# Patient Record
Sex: Female | Born: 1985 | Hispanic: Yes | Marital: Single | Smoking: Never smoker
Health system: Southern US, Community
[De-identification: ages and names within clinical notes are randomized; demographics above are authoritative.]

## PROBLEM LIST (undated history)

## (undated) DIAGNOSIS — K8020 Calculus of gallbladder without cholecystitis without obstruction: Secondary | ICD-10-CM

## (undated) HISTORY — PX: WISDOM TOOTH EXTRACTION: SHX21

---
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
## 2005-05-24 ENCOUNTER — Inpatient Hospital Stay (HOSPITAL_COMMUNITY): Admission: AD | Admit: 2005-05-24 | Discharge: 2005-05-24 | Payer: Self-pay | Admitting: Gynecology

## 2010-06-13 ENCOUNTER — Other Ambulatory Visit (HOSPITAL_COMMUNITY): Payer: Self-pay | Admitting: Obstetrics and Gynecology

## 2010-06-13 DIAGNOSIS — O2690 Pregnancy related conditions, unspecified, unspecified trimester: Secondary | ICD-10-CM

## 2010-06-13 DIAGNOSIS — Z0489 Encounter for examination and observation for other specified reasons: Secondary | ICD-10-CM

## 2010-06-16 ENCOUNTER — Other Ambulatory Visit (HOSPITAL_COMMUNITY): Payer: Self-pay | Admitting: Obstetrics and Gynecology

## 2010-06-16 ENCOUNTER — Ambulatory Visit (HOSPITAL_COMMUNITY)
Admission: RE | Admit: 2010-06-16 | Discharge: 2010-06-16 | Disposition: A | Discharge status: Home / Self Care | Payer: Medicaid Other | Source: Ambulatory Visit | Attending: Obstetrics and Gynecology | Admitting: Obstetrics and Gynecology

## 2010-06-16 DIAGNOSIS — O337XX0 Maternal care for disproportion due to other fetal deformities, not applicable or unspecified: Secondary | ICD-10-CM | POA: Insufficient documentation

## 2010-06-16 DIAGNOSIS — Z0489 Encounter for examination and observation for other specified reasons: Secondary | ICD-10-CM

## 2010-06-16 DIAGNOSIS — O09299 Supervision of pregnancy with other poor reproductive or obstetric history, unspecified trimester: Secondary | ICD-10-CM | POA: Insufficient documentation

## 2010-06-16 DIAGNOSIS — IMO0002 Reserved for concepts with insufficient information to code with codable children: Secondary | ICD-10-CM

## 2010-06-16 DIAGNOSIS — O352XX0 Maternal care for (suspected) hereditary disease in fetus, not applicable or unspecified: Secondary | ICD-10-CM | POA: Insufficient documentation

## 2010-06-16 DIAGNOSIS — O2690 Pregnancy related conditions, unspecified, unspecified trimester: Secondary | ICD-10-CM

## 2010-06-16 DIAGNOSIS — O358XX0 Maternal care for other (suspected) fetal abnormality and damage, not applicable or unspecified: Secondary | ICD-10-CM | POA: Insufficient documentation

## 2010-06-16 IMAGING — US US OB DETAIL+14 WK
1 series · 14 of 28 positions shown · non-contrast
Comparison: none

[Series 1: us ob detail+14 wk · 0.15mm/px · 14 of 115 slices shown]
[im 5/115]
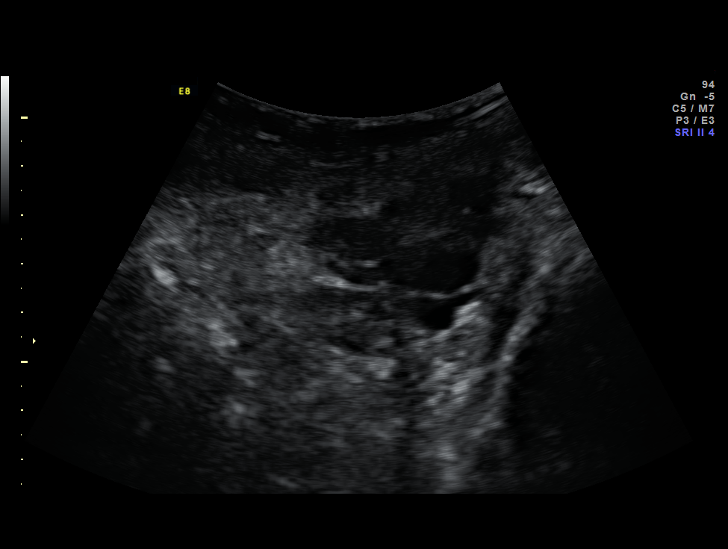
[im 13/115]
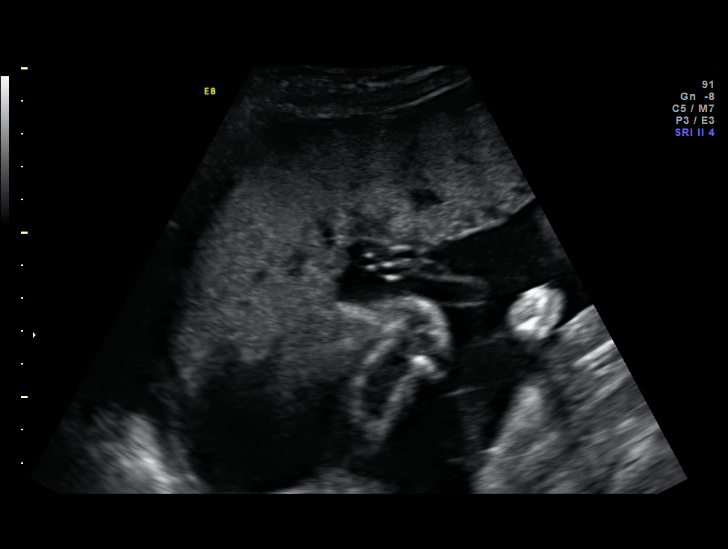
[im 22/115]
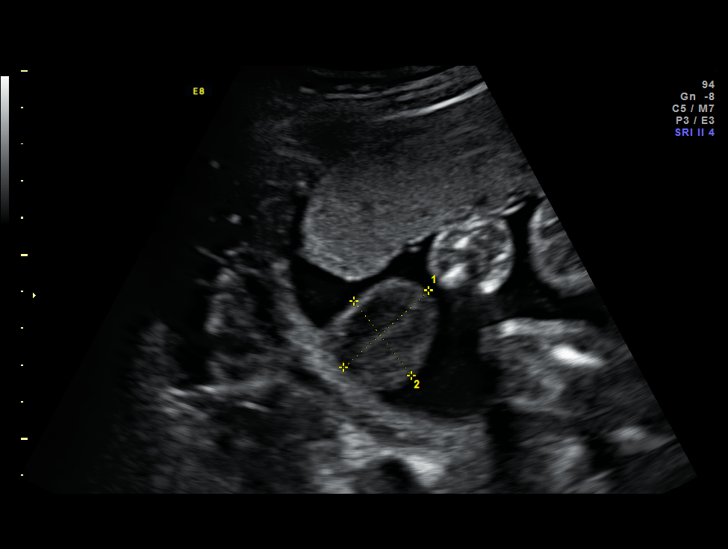
[im 30/115]
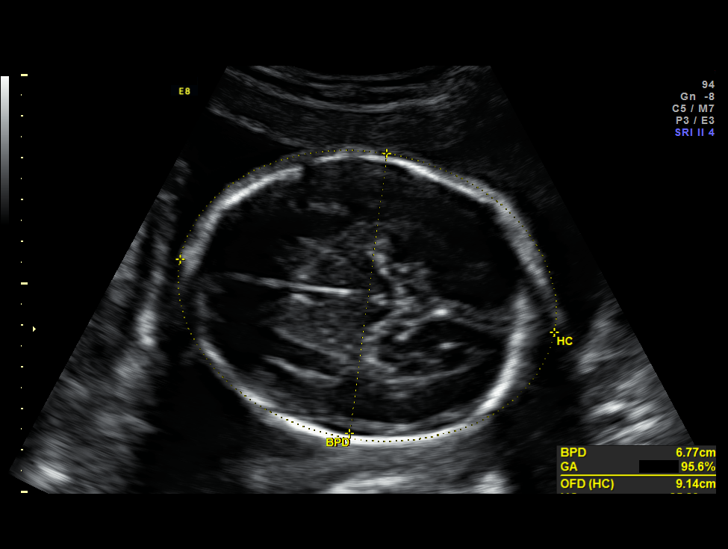
[im 39/115]
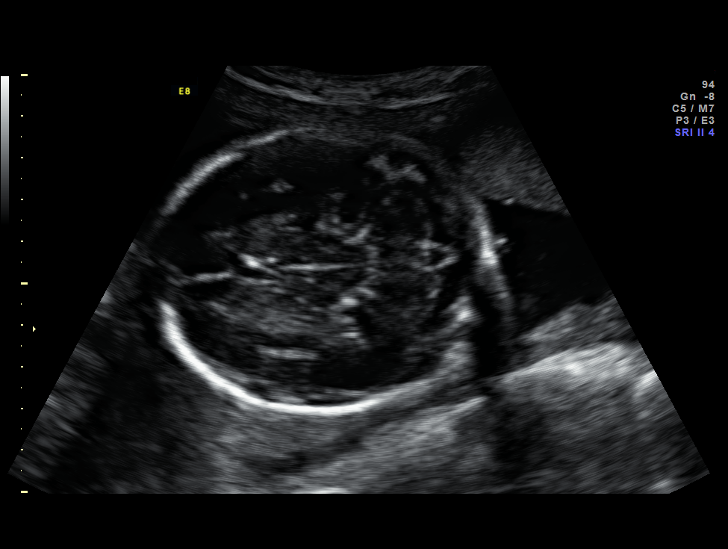
[im 47/115]
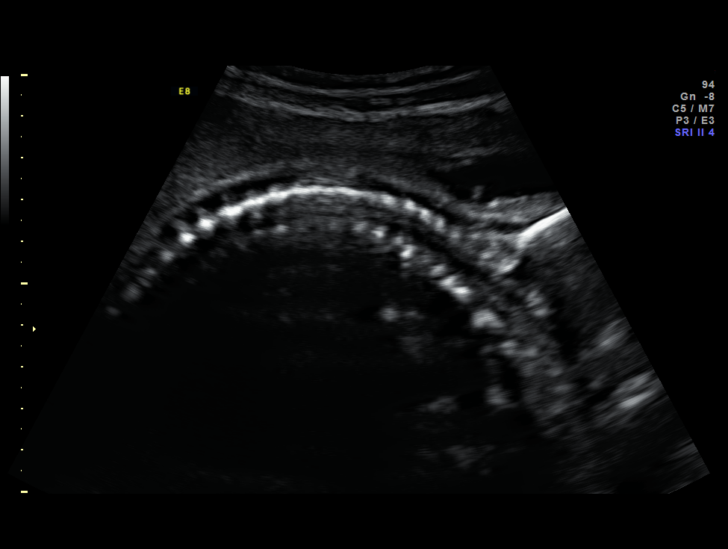
[im 55/115]
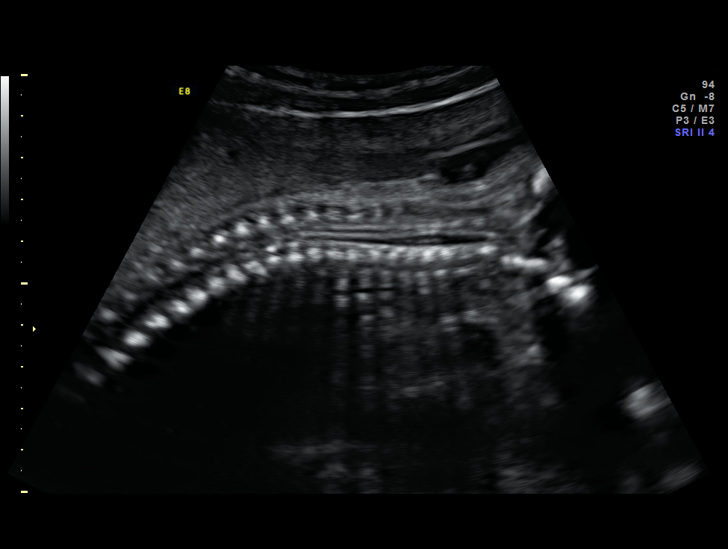
[im 64/115]
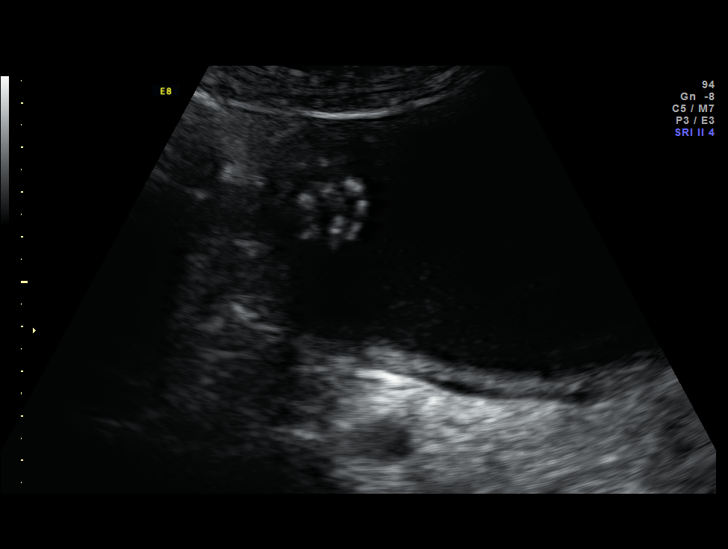
[im 72/115]
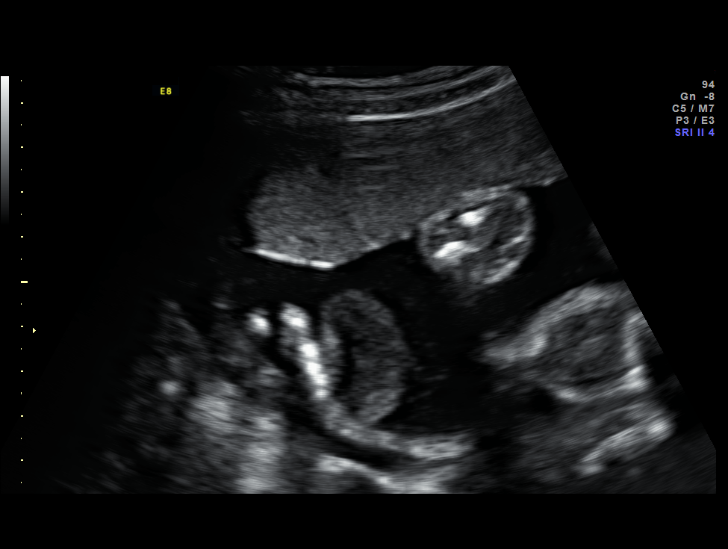
[im 81/115]
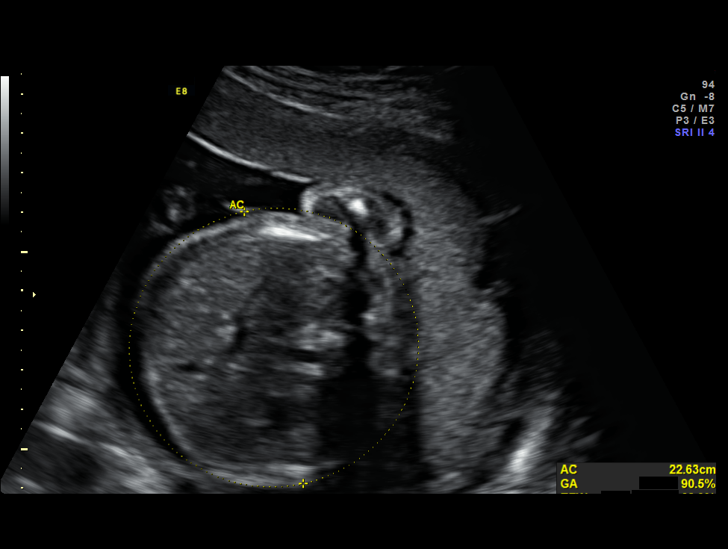
[im 89/115]
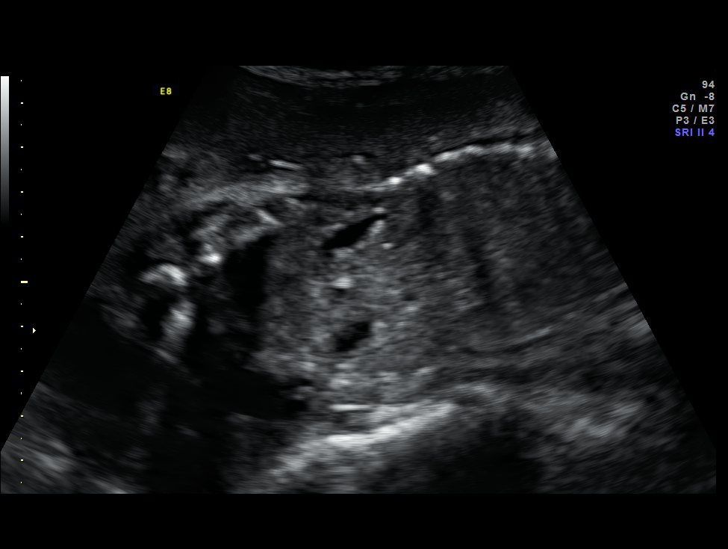
[im 98/115]
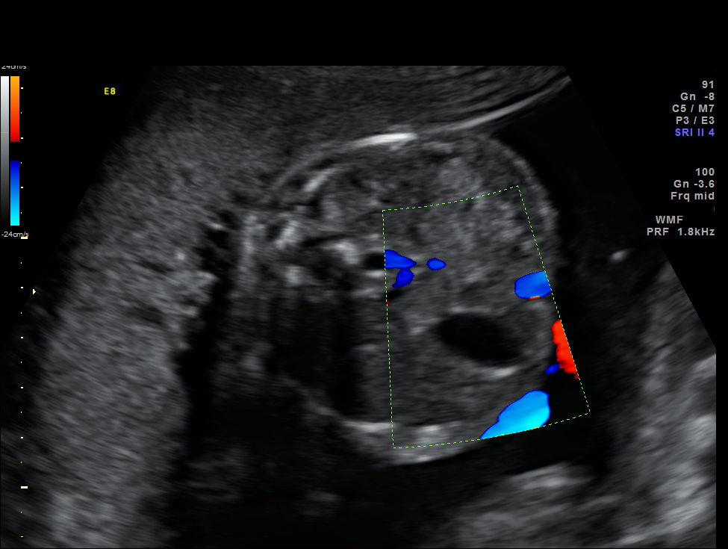
[im 106/115]
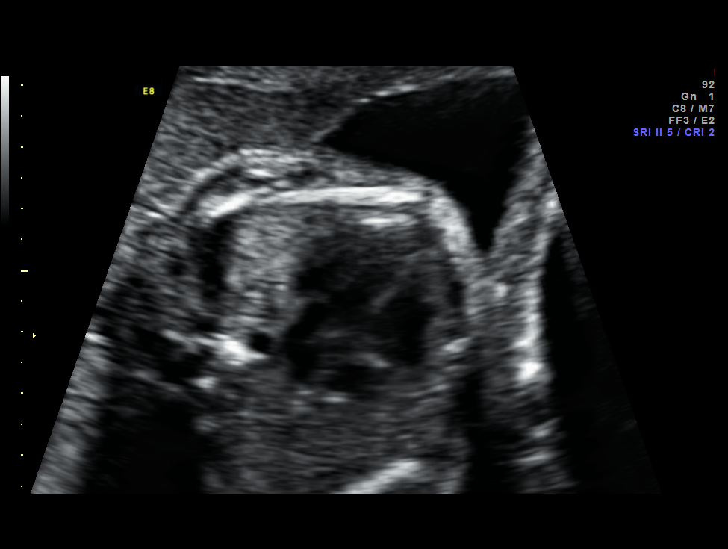
[im 115/115]
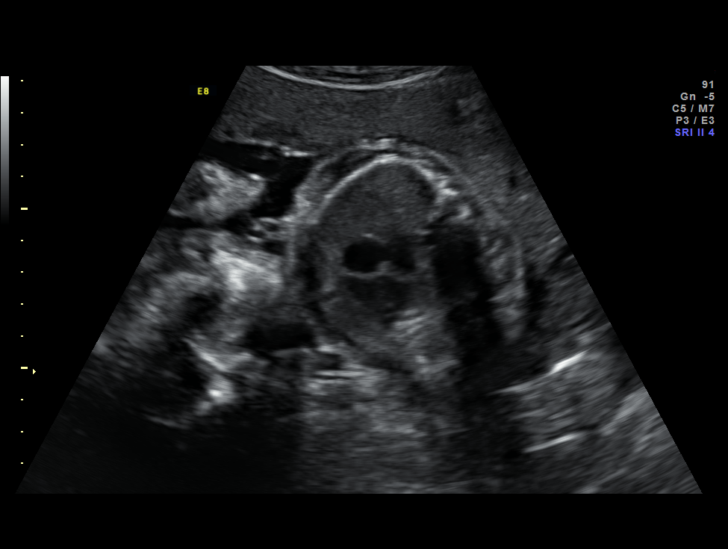

[14 of 28 positions shown; findings below may reference images not displayed]

Canned report from images found in remote index.

Refer to host system for actual result text.

## 2010-07-14 ENCOUNTER — Other Ambulatory Visit (HOSPITAL_COMMUNITY): Payer: Self-pay | Admitting: Obstetrics and Gynecology

## 2010-07-14 ENCOUNTER — Ambulatory Visit (HOSPITAL_COMMUNITY)
Admission: RE | Admit: 2010-07-14 | Discharge: 2010-07-14 | Disposition: A | Discharge status: Home / Self Care | Payer: Medicaid Other | Source: Ambulatory Visit | Attending: Obstetrics and Gynecology | Admitting: Obstetrics and Gynecology

## 2010-07-14 DIAGNOSIS — Z0489 Encounter for examination and observation for other specified reasons: Secondary | ICD-10-CM

## 2010-07-14 DIAGNOSIS — O2690 Pregnancy related conditions, unspecified, unspecified trimester: Secondary | ICD-10-CM

## 2010-07-14 DIAGNOSIS — O09299 Supervision of pregnancy with other poor reproductive or obstetric history, unspecified trimester: Secondary | ICD-10-CM | POA: Insufficient documentation

## 2010-07-14 DIAGNOSIS — O358XX0 Maternal care for other (suspected) fetal abnormality and damage, not applicable or unspecified: Secondary | ICD-10-CM | POA: Insufficient documentation

## 2010-07-14 DIAGNOSIS — O352XX0 Maternal care for (suspected) hereditary disease in fetus, not applicable or unspecified: Secondary | ICD-10-CM | POA: Insufficient documentation

## 2010-07-14 DIAGNOSIS — O337XX0 Maternal care for disproportion due to other fetal deformities, not applicable or unspecified: Secondary | ICD-10-CM | POA: Insufficient documentation

## 2010-08-23 ENCOUNTER — Ambulatory Visit (HOSPITAL_COMMUNITY)
Admission: RE | Admit: 2010-08-23 | Discharge: 2010-08-23 | Disposition: A | Discharge status: Home / Self Care | Payer: Medicaid Other | Source: Ambulatory Visit | Attending: Obstetrics and Gynecology | Admitting: Obstetrics and Gynecology

## 2010-08-23 ENCOUNTER — Encounter (HOSPITAL_COMMUNITY): Payer: Self-pay

## 2010-08-23 DIAGNOSIS — O352XX0 Maternal care for (suspected) hereditary disease in fetus, not applicable or unspecified: Secondary | ICD-10-CM | POA: Insufficient documentation

## 2010-08-23 DIAGNOSIS — Z0489 Encounter for examination and observation for other specified reasons: Secondary | ICD-10-CM

## 2010-08-23 DIAGNOSIS — O358XX0 Maternal care for other (suspected) fetal abnormality and damage, not applicable or unspecified: Secondary | ICD-10-CM | POA: Insufficient documentation

## 2010-08-23 DIAGNOSIS — O2690 Pregnancy related conditions, unspecified, unspecified trimester: Secondary | ICD-10-CM

## 2010-08-23 DIAGNOSIS — O09299 Supervision of pregnancy with other poor reproductive or obstetric history, unspecified trimester: Secondary | ICD-10-CM | POA: Insufficient documentation

## 2010-08-23 DIAGNOSIS — O337XX0 Maternal care for disproportion due to other fetal deformities, not applicable or unspecified: Secondary | ICD-10-CM | POA: Insufficient documentation

## 2010-11-22 ENCOUNTER — Encounter (HOSPITAL_COMMUNITY): Payer: Self-pay

## 2013-12-08 ENCOUNTER — Encounter (HOSPITAL_COMMUNITY): Payer: Self-pay

## 2014-08-28 ENCOUNTER — Emergency Department (HOSPITAL_COMMUNITY): Payer: Medicaid Other

## 2014-08-28 ENCOUNTER — Emergency Department (HOSPITAL_COMMUNITY)
Admission: EM | Admit: 2014-08-28 | Discharge: 2014-08-28 | Disposition: A | Discharge status: Home / Self Care | Payer: Medicaid Other | Attending: Emergency Medicine | Admitting: Emergency Medicine

## 2014-08-28 ENCOUNTER — Encounter (HOSPITAL_COMMUNITY): Payer: Self-pay | Admitting: *Deleted

## 2014-08-28 DIAGNOSIS — R300 Dysuria: Secondary | ICD-10-CM | POA: Insufficient documentation

## 2014-08-28 DIAGNOSIS — R1011 Right upper quadrant pain: Secondary | ICD-10-CM | POA: Diagnosis not present

## 2014-08-28 DIAGNOSIS — R630 Anorexia: Secondary | ICD-10-CM | POA: Insufficient documentation

## 2014-08-28 DIAGNOSIS — Z3202 Encounter for pregnancy test, result negative: Secondary | ICD-10-CM | POA: Diagnosis not present

## 2014-08-28 DIAGNOSIS — M546 Pain in thoracic spine: Secondary | ICD-10-CM | POA: Diagnosis not present

## 2014-08-28 DIAGNOSIS — R350 Frequency of micturition: Secondary | ICD-10-CM | POA: Diagnosis not present

## 2014-08-28 DIAGNOSIS — R1010 Upper abdominal pain, unspecified: Secondary | ICD-10-CM | POA: Diagnosis present

## 2014-08-28 DIAGNOSIS — Z79899 Other long term (current) drug therapy: Secondary | ICD-10-CM | POA: Diagnosis not present

## 2014-08-28 DIAGNOSIS — R110 Nausea: Secondary | ICD-10-CM | POA: Insufficient documentation

## 2014-08-28 LAB — CBC
HCT: 38.8 % (ref 36.0–46.0)
Hemoglobin: 13.1 g/dL (ref 12.0–15.0)
MCH: 28.7 pg (ref 26.0–34.0)
MCHC: 33.8 g/dL (ref 30.0–36.0)
MCV: 85.1 fL (ref 78.0–100.0)
PLATELETS: 291 10*3/uL (ref 150–400)
RBC: 4.56 MIL/uL (ref 3.87–5.11)
RDW: 13.3 % (ref 11.5–15.5)
WBC: 8.3 10*3/uL (ref 4.0–10.5)

## 2014-08-28 LAB — COMPREHENSIVE METABOLIC PANEL
ALT: 17 U/L (ref 14–54)
ANION GAP: 12 (ref 5–15)
AST: 17 U/L (ref 15–41)
Albumin: 4.3 g/dL (ref 3.5–5.0)
Alkaline Phosphatase: 65 U/L (ref 38–126)
BUN: 9 mg/dL (ref 6–20)
CHLORIDE: 103 mmol/L (ref 101–111)
CO2: 22 mmol/L (ref 22–32)
Calcium: 9.6 mg/dL (ref 8.9–10.3)
Creatinine, Ser: 0.66 mg/dL (ref 0.44–1.00)
GFR calc Af Amer: >60 mL/min (ref >60)
GFR calc non Af Amer: >60 mL/min (ref >60)
Glucose, Bld: 87 mg/dL (ref 65–99)
Potassium: 3.9 mmol/L (ref 3.5–5.1)
SODIUM: 137 mmol/L (ref 135–145)
TOTAL PROTEIN: 8.2 g/dL — AB (ref 6.5–8.1)
Total Bilirubin: 0.6 mg/dL (ref 0.3–1.2)

## 2014-08-28 LAB — URINE MICROSCOPIC-ADD ON

## 2014-08-28 LAB — URINALYSIS, ROUTINE W REFLEX MICROSCOPIC
Bilirubin Urine: NEGATIVE
GLUCOSE, UA: NEGATIVE mg/dL
Ketones, ur: NEGATIVE mg/dL
Leukocytes, UA: NEGATIVE
NITRITE: NEGATIVE
PH: 6.5 (ref 5.0–8.0)
PROTEIN: NEGATIVE mg/dL
SPECIFIC GRAVITY, URINE: 1.011 (ref 1.005–1.030)
Urobilinogen, UA: 0.2 mg/dL (ref 0.0–1.0)

## 2014-08-28 LAB — POC URINE PREG, ED: PREG TEST UR: NEGATIVE

## 2014-08-28 LAB — LIPASE, BLOOD: LIPASE: 28 U/L (ref 22–51)

## 2014-08-28 MED ORDER — ONDANSETRON HCL 4 MG/2ML IJ SOLN
4.0000 mg | Freq: Once | INTRAMUSCULAR | Status: AC | PRN
  Administered 2014-08-28: 4 mg via INTRAVENOUS
  Filled 2014-08-28: qty 2

## 2014-08-28 MED ORDER — MORPHINE SULFATE 4 MG/ML IJ SOLN
4.0000 mg | Freq: Once | INTRAMUSCULAR | Status: AC
  Administered 2014-08-28: 4 mg via INTRAVENOUS
  Filled 2014-08-28: qty 1

## 2014-08-28 MED ORDER — HYDROCODONE-ACETAMINOPHEN 5-325 MG PO TABS: 1.0000 | ORAL_TABLET | ORAL | Status: AC | PRN

## 2014-08-28 MED ORDER — ONDANSETRON 4 MG PO TBDP: ORAL_TABLET | ORAL | Status: AC

## 2014-08-28 NOTE — ED Notes (Signed)
pts urinalysis never performed in main lab; specimen found in ED lab- sent to main lab for analysis; ED MD requests patient stay until results appear; pt informed of delay in discharge

## 2014-08-28 NOTE — ED Notes (Signed)
Pt c/o sharp abdominal pain radiating to back for a couple of weeks. Pt also endorses n/d. Pt states she was dx with gall stones a couple of weeks ago. Pt denies dysuria.

## 2014-08-28 NOTE — ED Provider Notes (Signed)
CSN: [id number]     Arrival date & time 08/28/14  1730 History   First MD Initiated Contact with Patient 08/28/14 1800     Chief Complaint  Patient presents with  . Abdominal Pain  . Nausea     (Consider location/radiation/quality/duration/timing/severity/associated sxs/prior Treatment) HPI Chelsey Berger is a 29 y.o. female who comes in for evaluation of abdominal discomfort. Patient states she recently gave birth in October and is currently on Implanon therapy. She reports abdominal discomfort for the past 2 weeks. She characterizes this sensation as a burning sensation in her upper abdomen that radiates into her suprapubic region. She also reports associated mid back burning. She reports mild nausea, dysuria, frequency. She denies any fevers, chills, vomiting, unusual vaginal bleeding or discharge, pelvic pain. She also reports having been seen by her PCP and was told she had "many gallstones and needed to go to the ED to have it taken out". Patient denies any associated discomfort with eating, but does report she has had a decreased appetite over the past 2 weeks.  History reviewed. No pertinent past medical history. History reviewed. No pertinent past surgical history. History reviewed. No pertinent family history. History  Substance Use Topics  . Smoking status: Not on file  . Smokeless tobacco: Not on file  . Alcohol Use: Not on file   OB History    Gravida Para Term Preterm AB TAB SAB Ectopic Multiple Living   5 4 3 1  0 0 0 0 0 3     Review of Systems A 10 point review of systems was completed and was negative except for pertinent positives and negatives as mentioned in the history of present illness     Allergies  Review of patient's allergies indicates no known allergies.  Home Medications   Prior to Admission medications   Medication Sig Start Date End Date Taking? Authorizing Provider  acetaminophen (TYLENOL) 325 MG tablet Take 650 mg by mouth every 6 (six)  hours as needed for mild pain.   Yes Historical Provider, MD  etonogestrel (IMPLANON) 68 MG IMPL implant 1 each by Subdermal route once.   Yes Historical Provider, MD  pantoprazole (PROTONIX) 40 MG tablet Take 40 mg by mouth daily.   Yes Historical Provider, MD  HYDROcodone-acetaminophen (NORCO) 5-325 MG per tablet Take 1-2 tablets by mouth every 4 (four) hours as needed. 08/28/14   Joycie Peek, PA-C  ondansetron (ZOFRAN ODT) 4 MG disintegrating tablet 4mg  ODT q4 hours prn nausea/vomit 08/28/14   Mikolaj Woolstenhulme, PA-C   BP 134/85 mmHg  Pulse 83  Temp(Src) 97.9 F (36.6 C) (Oral)  Resp 16  Wt 140 lb (63.504 kg)  SpO2 100% Physical Exam  Constitutional: She is oriented to person, place, and time. She appears well-developed and well-nourished.  HENT:  Head: Normocephalic and atraumatic.  Mouth/Throat: Oropharynx is clear and moist.  Eyes: Conjunctivae are normal. Pupils are equal, round, and reactive to light. Right eye exhibits no discharge. Left eye exhibits no discharge. No scleral icterus.  Neck: Neck supple.  Cardiovascular: Normal rate, regular rhythm and normal heart sounds.   Pulmonary/Chest: Effort normal and breath sounds normal. No respiratory distress. She has no wheezes. She has no rales.  Abdominal: Soft. There is no tenderness.  Tenderness diffusely throughout abdomen with moderate palpation, worse in the right upper quadrant. Positive Murphy's. No rebound or guarding. Negative Rovsing's and McBurney's. Abdomen is otherwise soft and nondistended without other lesions or deformities.  Musculoskeletal: She exhibits no tenderness.  Neurological: She  is alert and oriented to person, place, and time.  Cranial Nerves II-XII grossly intact  Skin: Skin is warm and dry. No rash noted.  Psychiatric: She has a normal mood and affect.  Nursing note and vitals reviewed.   ED Course  Procedures (including critical care time) Labs Review Labs Reviewed  COMPREHENSIVE METABOLIC  PANEL - Abnormal; Notable for the following:    Total Protein 8.2 (*)    All other components within normal limits  LIPASE, BLOOD  CBC  URINALYSIS, ROUTINE W REFLEX MICROSCOPIC (NOT AT [facility])  POC URINE PREG, ED    Imaging Review US Abdomen Complete  08/28/2014   CLINICAL DATA:  29 year old female with right upper quadrant pain  EXAM: ULTRASOUND ABDOMEN COMPLETE  COMPARISON:  Ultrasound 08/25/2014  FINDINGS: Gallbladder: There multiple stones within the gallbladder. There is no gallbladder wall thickening or pericholecystic fluid. No tenderness was elicited over the gallbladder area during scanning.  Common bile duct: Diameter: 3 mm  Liver: No focal lesion identified. Within normal limits in parenchymal echogenicity.  IVC: No abnormality visualized.  Pancreas: Visualized portion unremarkable.  Spleen: Size and appearance within normal limits.  Right Kidney: Length: 11 cm. Echogenicity within normal limits. No mass or hydronephrosis visualized.  Left Kidney: Length: 10 cm. Echogenicity within normal limits. No mass or hydronephrosis visualized.  Abdominal aorta: No aneurysm visualized.  Other findings: None.  IMPRESSION: Cholelithiasis without sonographic evidence acute cholecystitis. A hepatobiliary scintigraphy may provide better evaluation of the gallbladder if an acute cholecystitis is clinically suspected.   Electronically Signed   By: Elgie Collard M.D.   On: 08/28/2014 20:40     EKG Interpretation None     Meds given in ED:  Medications  ondansetron [facility]) injection 4 mg (4 mg Intravenous Given 08/28/14 1823)  morphine 4 MG/ML injection 4 mg (4 mg Intravenous Given 08/28/14 1856)  morphine 4 MG/ML injection 4 mg (4 mg Intravenous Given 08/28/14 2106)    New Prescriptions   HYDROCODONE-ACETAMINOPHEN (NORCO) 5-325 MG PER TABLET    Take 1-2 tablets by mouth every 4 (four) hours as needed.   ONDANSETRON (ZOFRAN ODT) 4 MG DISINTEGRATING TABLET     ODT q4 hours prn nausea/vomit      Filed Vitals:   08/28/14 1734 08/28/14 1900 08/28/14 2100  BP: 145/78 120/78 134/85  Pulse: 76 85 83  Temp: 97.9 F (36.6 C)    TempSrc: Oral    Resp: 16    Weight: 140 lb (63.504 kg)    SpO2: 100% 99% 100%    MDM  Vitals stable - WNL -afebrile Pt resting comfortably in ED. pain is controlled in the ED. PE--physical exam with right upper quadrant tenderness, improving on repeat exams. Labwork--labs noncontributory. No leukocytosis, LFTs normal, alkaline phosphatase normal. Hemoglobin and urinalysis, but no evidence of acute UTI. Imaging--ultrasound right upper quadrant shows cholelithiasis without any evidence of cholecystitis.  Discussed labs and imaging with patient and explained no need for emergent cholecystectomy.  Will give referral to general surgery for further evaluation of right upper quadrant pain. Given Zofran for nausea and short course pain medicines.. I discussed all relevant lab findings and imaging results with pt and they verbalized understanding. Discussed f/u with PCP within 48 hrs and return precautions, pt very amenable to plan. Prior to patient discharge, I discussed and reviewed this case with Dr. Criss Alvine  Final diagnoses:  RUQ pain        Joycie Peek, PA-C 08/29/14 0021  Pricilla Loveless, MD 08/29/14 [phone]

## 2014-08-28 NOTE — Discharge Instructions (Signed)
You were evaluated in the ED today for your right upper quadrant pain. It does not appear to be an emergent cause for your symptoms at this time. It is important for you to follow-up with general surgery for reevaluation of your right upper quadrant pain. You do not appear to have an infected gallbladder at this time, however you do have gallstones. Please take your pain medicines as needed for pain, your antinausea medicine for nausea. Return to ED for new or worsening symptoms.  Biliary Colic  Biliary colic is a steady or irregular pain in the upper abdomen. It is usually under the right side of the rib cage. It happens when gallstones interfere with the normal flow of bile from the gallbladder. Bile is a liquid that helps to digest fats. Bile is made in the liver and stored in the gallbladder. When you eat a meal, bile passes from the gallbladder through the cystic duct and the common bile duct into the small intestine. There, it mixes with partially digested food. If a gallstone blocks either of these ducts, the normal flow of bile is blocked. The muscle cells in the bile duct contract forcefully to try to move the stone. This causes the pain of biliary colic.  SYMPTOMS   A person with biliary colic usually complains of pain in the upper abdomen. This pain can be:  In the center of the upper abdomen just below the breastbone.  In the upper-right part of the abdomen, near the gallbladder and liver.  Spread back toward the right shoulder blade.  Nausea and vomiting.  The pain usually occurs after eating.  Biliary colic is usually triggered by the digestive system's demand for bile. The demand for bile is high after fatty meals. Symptoms can also occur when a person who has been fasting suddenly eats a very large meal. Most episodes of biliary colic pass after 1 to 5 hours. After the most intense pain passes, your abdomen may continue to ache mildly for about 24 hours. DIAGNOSIS  After you  describe your symptoms, your caregiver will perform a physical exam. He or she will pay attention to the upper right portion of your belly (abdomen). This is the area of your liver and gallbladder. An ultrasound will help your caregiver look for gallstones. Specialized scans of the gallbladder may also be done. Blood tests may be done, especially if you have fever or if your pain persists. PREVENTION  Biliary colic can be prevented by controlling the risk factors for gallstones. Some of these risk factors, such as heredity, increasing age, and pregnancy are a normal part of life. Obesity and a high-fat diet are risk factors you can change through a healthy lifestyle. Women going through menopause who take hormone replacement therapy (estrogen) are also more likely to develop biliary colic. TREATMENT   Pain medication may be prescribed.  You may be encouraged to eat a fat-free diet.  If the first episode of biliary colic is severe, or episodes of colic keep retuning, surgery to remove the gallbladder (cholecystectomy) is usually recommended. This procedure can be done through small incisions using an instrument called a laparoscope. The procedure often requires a brief stay in the hospital. Some people can leave the hospital the same day. It is the most widely used treatment in people troubled by painful gallstones. It is effective and safe, with no complications in more than 90% of cases.  If surgery cannot be done, medication that dissolves gallstones may be used. This medication  is expensive and can take months or years to work. Only small stones will dissolve.  Rarely, medication to dissolve gallstones is combined with a procedure called shock-wave lithotripsy. This procedure uses carefully aimed shock waves to break up gallstones. In many people treated with this procedure, gallstones form again within a few years. PROGNOSIS  If gallstones block your cystic duct or common bile duct, you are at  risk for repeated episodes of biliary colic. There is also a 25% chance that you will develop a gallbladder infection(acute cholecystitis), or some other complication of gallstones within 10 to 20 years. If you have surgery, schedule it at a time that is convenient for you and at a time when you are not sick. HOME CARE INSTRUCTIONS   Drink plenty of clear fluids.  Avoid fatty, greasy or fried foods, or any foods that make your pain worse.  Take medications as directed. SEEK MEDICAL CARE IF:   You develop a fever over 100.5 F (38.1 C).  Your pain gets worse over time.  You develop nausea that prevents you from eating and drinking.  You develop vomiting. SEEK IMMEDIATE MEDICAL CARE IF:   You have continuous or severe belly (abdominal) pain which is not relieved with medications.  You develop nausea and vomiting which is not relieved with medications.  You have symptoms of biliary colic and you suddenly develop a fever and shaking chills. This may signal cholecystitis. Call your caregiver immediately.  You develop a yellow color to your skin or the white part of your eyes (jaundice). Document Released: 06/26/2005 Document Revised: 04/17/2011 Document Reviewed: 09/05/2007 [facility] Patient Information 2015 [place], [place]. This information is not intended to replace advice given to you by your health care provider. Make sure you discuss any questions you have with your health care provider.

## 2014-08-28 NOTE — ED Notes (Signed)
Patient transported to Ultrasound 

## 2014-09-08 NOTE — Pre-Procedure Instructions (Signed)
Chelsey Berger  09/08/2014     Your procedure is scheduled on : Friday September 11, 2014 at 9:30 AM.  Report to [facility] Admitting at 7:30 A.M.  Call this number if you have problems the morning of surgery: [phone]   Remember:  Do not eat food or drink liquids after midnight.  Take these medicines the morning of surgery with A SIP OF WATER : Acetaminophen (Tylenol) if needed, Hydrocodone if needed, Ondansetron (Zofran) if needed   Do not wear jewelry, make-up or nail polish.  Do not wear lotions, powders, or perfumes.    Do not shave 48 hours prior to surgery.    Do not bring valuables to the hospital.  [facility] is not responsible for any belongings or valuables.  Contacts, dentures or bridgework may not be worn into surgery.  Leave your suitcase in the car.  After surgery it may be brought to your room.  For patients admitted to the hospital, discharge time will be determined by your treatment team.  Patients discharged the day of surgery will not be allowed to drive home.   Name and phone number of your driver:    Special instructions:  Shower using CHG soap the night before and the morning of your surgery  Please read over the following fact sheets that you were given. Pain Booklet, Coughing and Deep Breathing and Surgical Site Infection Prevention

## 2014-09-09 ENCOUNTER — Encounter (HOSPITAL_COMMUNITY)
Admission: RE | Admit: 2014-09-09 | Discharge: 2014-09-09 | Disposition: A | Discharge status: Home / Self Care | Payer: Medicaid Other | Source: Ambulatory Visit | Attending: General Surgery | Admitting: General Surgery

## 2014-09-09 ENCOUNTER — Encounter (HOSPITAL_COMMUNITY): Payer: Self-pay

## 2014-09-09 DIAGNOSIS — Z01812 Encounter for preprocedural laboratory examination: Secondary | ICD-10-CM | POA: Diagnosis present

## 2014-09-09 DIAGNOSIS — K8020 Calculus of gallbladder without cholecystitis without obstruction: Secondary | ICD-10-CM | POA: Insufficient documentation

## 2014-09-09 HISTORY — DX: Calculus of gallbladder without cholecystitis without obstruction: K80.20

## 2014-09-09 LAB — CBC
HCT: 39.1 % (ref 36.0–46.0)
HEMOGLOBIN: 13 g/dL (ref 12.0–15.0)
MCH: 28.4 pg (ref 26.0–34.0)
MCHC: 33.2 g/dL (ref 30.0–36.0)
MCV: 85.4 fL (ref 78.0–100.0)
Platelets: 333 10*3/uL (ref 150–400)
RBC: 4.58 MIL/uL (ref 3.87–5.11)
RDW: 13.3 % (ref 11.5–15.5)
WBC: 6.7 10*3/uL (ref 4.0–10.5)

## 2014-09-09 LAB — HCG, SERUM, QUALITATIVE: Preg, Serum: NEGATIVE

## 2014-09-09 NOTE — Progress Notes (Signed)
PCP is Robert L. Dough  Patient denied having any cardiac or pulmonary issues

## 2014-09-11 ENCOUNTER — Ambulatory Visit (HOSPITAL_COMMUNITY)
Admission: RE | Admit: 2014-09-11 | Discharge: 2014-09-11 | Disposition: A | Discharge status: Home / Self Care | Payer: Medicaid Other | Source: Ambulatory Visit | Attending: General Surgery | Admitting: General Surgery

## 2014-09-11 ENCOUNTER — Encounter (HOSPITAL_COMMUNITY): Payer: Self-pay | Admitting: *Deleted

## 2014-09-11 ENCOUNTER — Ambulatory Visit (HOSPITAL_COMMUNITY): Payer: Medicaid Other | Admitting: Anesthesiology

## 2014-09-11 ENCOUNTER — Encounter (HOSPITAL_COMMUNITY)
Admission: RE | Disposition: A | Discharge status: Home / Self Care | Payer: Self-pay | Source: Ambulatory Visit | Attending: General Surgery

## 2014-09-11 DIAGNOSIS — K8020 Calculus of gallbladder without cholecystitis without obstruction: Secondary | ICD-10-CM | POA: Insufficient documentation

## 2014-09-11 DIAGNOSIS — K429 Umbilical hernia without obstruction or gangrene: Secondary | ICD-10-CM | POA: Diagnosis not present

## 2014-09-11 HISTORY — PX: CHOLECYSTECTOMY: SHX55

## 2014-09-11 HISTORY — PX: UMBILICAL HERNIA REPAIR: SHX196

## 2014-09-11 SURGERY — LAPAROSCOPIC CHOLECYSTECTOMY
Anesthesia: General | Site: Abdomen

## 2014-09-11 MED ORDER — BUPIVACAINE-EPINEPHRINE (PF) 0.25% -1:200000 IJ SOLN
INTRAMUSCULAR | Status: AC
  Filled 2014-09-11: qty 30

## 2014-09-11 MED ORDER — MIDAZOLAM HCL 5 MG/5ML IJ SOLN
INTRAMUSCULAR | Status: DC | PRN
Start: 2014-09-11 — End: 2014-09-11
  Administered 2014-09-11: 2 mg via INTRAVENOUS

## 2014-09-11 MED ORDER — OXYCODONE-ACETAMINOPHEN 10-325 MG PO TABS: 1.0000 | ORAL_TABLET | Freq: Four times a day (QID) | ORAL | Status: AC | PRN

## 2014-09-11 MED ORDER — FENTANYL CITRATE (PF) 250 MCG/5ML IJ SOLN
INTRAMUSCULAR | Status: AC
  Filled 2014-09-11: qty 5

## 2014-09-11 MED ORDER — SODIUM CHLORIDE 0.9 % IV SOLN: 250.0000 mL | INTRAVENOUS | Status: DC | PRN

## 2014-09-11 MED ORDER — HYDROMORPHONE HCL 1 MG/ML IJ SOLN
INTRAMUSCULAR | Status: AC
  Administered 2014-09-11: 0.5 mg via INTRAVENOUS
  Filled 2014-09-11: qty 1

## 2014-09-11 MED ORDER — MORPHINE SULFATE 2 MG/ML IJ SOLN: 2.0000 mg | INTRAMUSCULAR | Status: DC | PRN

## 2014-09-11 MED ORDER — GLYCOPYRROLATE 0.2 MG/ML IJ SOLN
INTRAMUSCULAR | Status: AC
  Filled 2014-09-11: qty 2

## 2014-09-11 MED ORDER — HYDROMORPHONE HCL 1 MG/ML IJ SOLN
INTRAMUSCULAR | Status: AC
  Filled 2014-09-11: qty 1

## 2014-09-11 MED ORDER — DEXAMETHASONE SODIUM PHOSPHATE 4 MG/ML IJ SOLN
INTRAMUSCULAR | Status: DC | PRN
  Administered 2014-09-11: 8 mg via INTRAVENOUS

## 2014-09-11 MED ORDER — SODIUM CHLORIDE 0.9 % IJ SOLN: 3.0000 mL | Freq: Two times a day (BID) | INTRAMUSCULAR | Status: DC

## 2014-09-11 MED ORDER — CEFAZOLIN SODIUM-DEXTROSE 2-3 GM-% IV SOLR
2.0000 g | INTRAVENOUS | Status: AC
  Administered 2014-09-11: 2 g via INTRAVENOUS

## 2014-09-11 MED ORDER — ARTIFICIAL TEARS OP OINT
TOPICAL_OINTMENT | OPHTHALMIC | Status: AC
  Filled 2014-09-11: qty 3.5

## 2014-09-11 MED ORDER — DEXAMETHASONE SODIUM PHOSPHATE 4 MG/ML IJ SOLN
INTRAMUSCULAR | Status: AC
  Filled 2014-09-11: qty 2

## 2014-09-11 MED ORDER — SODIUM CHLORIDE 0.9 % IV SOLN: INTRAVENOUS | Status: DC

## 2014-09-11 MED ORDER — LIDOCAINE HCL (CARDIAC) 20 MG/ML IV SOLN
INTRAVENOUS | Status: DC | PRN
  Administered 2014-09-11: 100 mg via INTRAVENOUS

## 2014-09-11 MED ORDER — PROPOFOL 10 MG/ML IV BOLUS
INTRAVENOUS | Status: AC
  Filled 2014-09-11: qty 20

## 2014-09-11 MED ORDER — CEFAZOLIN (ANCEF) 1 G IV SOLR: 2.0000 g | INTRAVENOUS | Status: DC

## 2014-09-11 MED ORDER — HYDROMORPHONE HCL 1 MG/ML IJ SOLN
0.2500 mg | INTRAMUSCULAR | Status: DC | PRN
  Administered 2014-09-11 (×3): 0.5 mg via INTRAVENOUS

## 2014-09-11 MED ORDER — GLYCOPYRROLATE 0.2 MG/ML IJ SOLN
INTRAMUSCULAR | Status: DC | PRN
  Administered 2014-09-11: 0.4 mg via INTRAVENOUS

## 2014-09-11 MED ORDER — SODIUM CHLORIDE 0.9 % IR SOLN
Status: DC | PRN
  Administered 2014-09-11: 1000 mL

## 2014-09-11 MED ORDER — ACETAMINOPHEN 650 MG RE SUPP
650.0000 mg | RECTAL | Status: DC | PRN
  Filled 2014-09-11: qty 1

## 2014-09-11 MED ORDER — MIDAZOLAM HCL 2 MG/2ML IJ SOLN
INTRAMUSCULAR | Status: AC
  Filled 2014-09-11: qty 4

## 2014-09-11 MED ORDER — NEOSTIGMINE METHYLSULFATE 10 MG/10ML IV SOLN
INTRAVENOUS | Status: DC | PRN
  Administered 2014-09-11: 3 mg via INTRAVENOUS

## 2014-09-11 MED ORDER — BUPIVACAINE-EPINEPHRINE 0.25% -1:200000 IJ SOLN
INTRAMUSCULAR | Status: DC | PRN
  Administered 2014-09-11: 15 mL

## 2014-09-11 MED ORDER — NEOSTIGMINE METHYLSULFATE 10 MG/10ML IV SOLN
INTRAVENOUS | Status: AC
  Filled 2014-09-11: qty 1

## 2014-09-11 MED ORDER — PROPOFOL 10 MG/ML IV BOLUS
INTRAVENOUS | Status: DC | PRN
  Administered 2014-09-11: 150 mg via INTRAVENOUS
  Administered 2014-09-11: 10 mg via INTRAVENOUS

## 2014-09-11 MED ORDER — 0.9 % SODIUM CHLORIDE (POUR BTL) OPTIME
TOPICAL | Status: DC | PRN
  Administered 2014-09-11: 1000 mL

## 2014-09-11 MED ORDER — LACTATED RINGERS IV SOLN
INTRAVENOUS | Status: DC
  Administered 2014-09-11 (×3): via INTRAVENOUS

## 2014-09-11 MED ORDER — OXYCODONE HCL 5 MG PO TABS
5.0000 mg | ORAL_TABLET | ORAL | Status: DC | PRN
  Administered 2014-09-11: 10 mg via ORAL

## 2014-09-11 MED ORDER — ACETAMINOPHEN 325 MG PO TABS
650.0000 mg | ORAL_TABLET | ORAL | Status: DC | PRN
  Filled 2014-09-11: qty 2

## 2014-09-11 MED ORDER — SODIUM CHLORIDE 0.9 % IJ SOLN: 3.0000 mL | INTRAMUSCULAR | Status: DC | PRN

## 2014-09-11 MED ORDER — KETOROLAC TROMETHAMINE 30 MG/ML IJ SOLN
INTRAMUSCULAR | Status: AC
  Administered 2014-09-11: 30 mg via INTRAVENOUS
  Filled 2014-09-11: qty 1

## 2014-09-11 MED ORDER — ROCURONIUM BROMIDE 50 MG/5ML IV SOLN
INTRAVENOUS | Status: AC
  Filled 2014-09-11: qty 1

## 2014-09-11 MED ORDER — CEFAZOLIN SODIUM-DEXTROSE 2-3 GM-% IV SOLR
INTRAVENOUS | Status: AC
  Filled 2014-09-11: qty 50

## 2014-09-11 MED ORDER — ONDANSETRON HCL 4 MG/2ML IJ SOLN
INTRAMUSCULAR | Status: DC | PRN
  Administered 2014-09-11: 4 mg via INTRAVENOUS

## 2014-09-11 MED ORDER — FENTANYL CITRATE (PF) 100 MCG/2ML IJ SOLN
INTRAMUSCULAR | Status: DC | PRN
  Administered 2014-09-11: 50 ug via INTRAVENOUS
  Administered 2014-09-11: 100 ug via INTRAVENOUS
  Administered 2014-09-11: 50 ug via INTRAVENOUS
  Administered 2014-09-11: 100 ug via INTRAVENOUS
  Administered 2014-09-11 (×4): 50 ug via INTRAVENOUS

## 2014-09-11 MED ORDER — KETOROLAC TROMETHAMINE 30 MG/ML IJ SOLN: 30.0000 mg | Freq: Once | INTRAMUSCULAR | Status: DC

## 2014-09-11 MED ORDER — LIDOCAINE HCL (CARDIAC) 20 MG/ML IV SOLN
INTRAVENOUS | Status: AC
  Filled 2014-09-11: qty 5

## 2014-09-11 MED ORDER — ONDANSETRON HCL 4 MG/2ML IJ SOLN
INTRAMUSCULAR | Status: AC
  Filled 2014-09-11: qty 2

## 2014-09-11 MED ORDER — OXYCODONE HCL 5 MG PO TABS
ORAL_TABLET | ORAL | Status: AC
  Administered 2014-09-11: 10 mg via ORAL
  Filled 2014-09-11: qty 2

## 2014-09-11 MED ORDER — ROCURONIUM BROMIDE 100 MG/10ML IV SOLN
INTRAVENOUS | Status: DC | PRN
  Administered 2014-09-11: 40 mg via INTRAVENOUS

## 2014-09-11 SURGICAL SUPPLY — 46 items
APPLIER CLIP 5 13 M/L LIGAMAX5 (MISCELLANEOUS) ×4
APR CLP MED LRG 5 ANG JAW (MISCELLANEOUS) ×2
BAG SPEC RTRVL 10 TROC 200 (ENDOMECHANICALS) ×2
BLADE SURG ROTATE 9660 (MISCELLANEOUS) IMPLANT
CANISTER SUCTION 2500CC (MISCELLANEOUS) ×4 IMPLANT
CHLORAPREP W/TINT 26ML (MISCELLANEOUS) ×4 IMPLANT
CLIP APPLIE 5 13 M/L LIGAMAX5 (MISCELLANEOUS) ×2 IMPLANT
CLOSURE WOUND 1/2 X4 (GAUZE/BANDAGES/DRESSINGS) ×1
COVER SURGICAL LIGHT HANDLE (MISCELLANEOUS) ×4 IMPLANT
DEVICE TROCAR PUNCTURE CLOSURE (ENDOMECHANICALS) ×2 IMPLANT
ELECT REM PT RETURN 9FT ADLT (ELECTROSURGICAL) ×4
ELECTRODE REM PT RTRN 9FT ADLT (ELECTROSURGICAL) ×2 IMPLANT
GLOVE BIO SURGEON STRL SZ 6.5 (GLOVE) ×1 IMPLANT
GLOVE BIO SURGEON STRL SZ7 (GLOVE) ×4 IMPLANT
GLOVE BIO SURGEONS STRL SZ 6.5 (GLOVE) ×1
GLOVE BIOGEL PI IND STRL 6.5 (GLOVE) IMPLANT
GLOVE BIOGEL PI IND STRL 7.0 (GLOVE) IMPLANT
GLOVE BIOGEL PI IND STRL 7.5 (GLOVE) ×2 IMPLANT
GLOVE BIOGEL PI INDICATOR 6.5 (GLOVE) ×2
GLOVE BIOGEL PI INDICATOR 7.0 (GLOVE) ×2
GLOVE BIOGEL PI INDICATOR 7.5 (GLOVE) ×2
GLOVE SURG SS PI 7.0 STRL IVOR (GLOVE) ×2 IMPLANT
GOWN STRL REUS W/ TWL LRG LVL3 (GOWN DISPOSABLE) ×6 IMPLANT
GOWN STRL REUS W/TWL LRG LVL3 (GOWN DISPOSABLE) ×12
KIT BASIN OR (CUSTOM PROCEDURE TRAY) ×4 IMPLANT
KIT ROOM TURNOVER OR (KITS) ×4 IMPLANT
LIQUID BAND (GAUZE/BANDAGES/DRESSINGS) ×4 IMPLANT
NS IRRIG 1000ML POUR BTL (IV SOLUTION) ×4 IMPLANT
PAD ARMBOARD 7.5X6 YLW CONV (MISCELLANEOUS) ×4 IMPLANT
PENCIL BUTTON HOLSTER BLD 10FT (ELECTRODE) ×2 IMPLANT
POUCH RETRIEVAL ECOSAC 10 (ENDOMECHANICALS) ×2 IMPLANT
POUCH RETRIEVAL ECOSAC 10MM (ENDOMECHANICALS) ×2
SCISSORS LAP 5X35 DISP (ENDOMECHANICALS) ×4 IMPLANT
SET IRRIG TUBING LAPAROSCOPIC (IRRIGATION / IRRIGATOR) ×4 IMPLANT
SLEEVE ENDOPATH XCEL 5M (ENDOMECHANICALS) ×8 IMPLANT
SPECIMEN JAR SMALL (MISCELLANEOUS) ×4 IMPLANT
STRIP CLOSURE SKIN 1/2X4 (GAUZE/BANDAGES/DRESSINGS) ×3 IMPLANT
SUT ETHIBOND NAB CT1 #1 30IN (SUTURE) ×4 IMPLANT
SUT MNCRL AB 4-0 PS2 18 (SUTURE) ×6 IMPLANT
SUT VIC AB 3-0 SH 27 (SUTURE) ×8
SUT VIC AB 3-0 SH 27X BRD (SUTURE) IMPLANT
TOWEL OR 17X24 6PK STRL BLUE (TOWEL DISPOSABLE) ×4 IMPLANT
TRAY LAPAROSCOPIC MC (CUSTOM PROCEDURE TRAY) ×4 IMPLANT
TROCAR XCEL BLUNT TIP 100MML (ENDOMECHANICALS) ×4 IMPLANT
TROCAR XCEL NON-BLD 5MMX100MML (ENDOMECHANICALS) ×4 IMPLANT
TUBING INSUFFLATION (TUBING) ×4 IMPLANT

## 2014-09-11 NOTE — Progress Notes (Signed)
Receiving report from [place] RN at this time

## 2014-09-11 NOTE — Interval H&P Note (Signed)
History and Physical Interval Note:  09/11/2014 8:56 AM  Chelsey Berger  has presented today for surgery, with the diagnosis of gallstones  The various methods of treatment have been discussed with the patient and family. After consideration of risks, benefits and other options for treatment, the patient has consented to  Procedure(s): LAPAROSCOPIC CHOLECYSTECTOMY (N/A) as a surgical intervention .  The patient's history has been reviewed, patient examined, no change in status, stable for surgery.  I have reviewed the patient's chart and labs.  Questions were answered to the patient's satisfaction.     Blanca Thornton

## 2014-09-11 NOTE — Discharge Instructions (Signed)
[facility] LAPAROSCOPIC SURGERY: POST OP INSTRUCTIONS  Always review your discharge instruction sheet given to you by the facility where your surgery was performed. IF YOU HAVE DISABILITY OR FAMILY LEAVE FORMS, YOU MUST BRING THEM TO THE OFFICE FOR PROCESSING.   DO NOT GIVE THEM TO YOUR DOCTOR.  1. A prescription for pain medication may be given to you upon discharge.  Take your pain medication as prescribed, if needed.  If narcotic pain medicine is not needed, then you may take acetaminophen (Tylenol), naprosyn (Alleve), or ibuprofen (Advil) as needed. 2. Take your usually prescribed medications unless otherwise directed. 3. If you need a refill on your pain medication, please contact your pharmacy.  They will contact our office to request authorization. Prescriptions will not be filled after 5pm or on week-ends. 4. You should follow a light diet the first few days after arrival home, such as soup and crackers, etc.  Be sure to include lots of fluids daily. 5. Most patients will experience some swelling and bruising in the area of the incisions.  Ice packs will help.  Swelling and bruising can take several days to resolve.  6. It is common to experience some constipation if taking pain medication after surgery.  Increasing fluid intake and taking a stool softener (such as Colace) will usually help or prevent this problem from occurring.  A mild laxative (Milk of Magnesia or Miralax) should be taken according to package instructions if there are no bowel movements after 48 hours. 7. Unless discharge instructions indicate otherwise, you may remove your bandages 48 hours after surgery, and you may shower at that time.  You may have steri-strips (small skin tapes) in place directly over the incision.  These strips should be left on the skin for 7-10 days.  If your surgeon used skin glue on the incision, you may shower in 24 hours.  The glue will flake  off over the next 2-3 weeks.  Any sutures or staples will be removed at the office during your follow-up visit. 8. ACTIVITIES:  You may resume regular (light) daily activities beginning the next day--such as daily self-care, walking, climbing stairs--gradually increasing activities as tolerated.  You may have sexual intercourse when it is comfortable.  Refrain from any heavy lifting or straining until approved by your doctor. a. You may drive when you are no longer taking prescription pain medication, you can comfortably wear a seatbelt, and you can safely maneuver your car and apply brakes. b. RETURN TO WORK:  __________________________________________________________ 9. You should see your doctor in the office for a follow-up appointment approximately 2-3 weeks after your surgery.  Make sure that you call for this appointment within a day or two after you arrive home to insure a convenient appointment time. 10. OTHER INSTRUCTIONS: __________________________________________________________________________________________________________________________ __________________________________________________________________________________________________________________________ WHEN TO CALL YOUR DOCTOR: 1. Fever over 101.0 2. Inability to urinate 3. Continued bleeding from incision. 4. Increased pain, redness, or drainage from the incision. 5. Increasing abdominal pain  The clinic staff is available to answer your questions during regular business hours.  Please don't hesitate to call and ask to speak to one of the nurses for clinical concerns.  If you have a medical emergency, go to the nearest emergency room or call 911.  A surgeon from [facility] is always on call at the hospital. [address], [address], [place], [place]  [address] ? [address] [address], [place], [place]   [address] [phone] ? [phone] ? [phone] Web site: [email or web address]  

## 2014-09-11 NOTE — Op Note (Signed)
Preoperative diagnosis: biliary colic, umbilical hernia Postoperative diagnosis: same as above Procedure: laparoscopic cholecystectomy, primary umbilical hernia repair Surgeon: Dr Harden Mo Anesthesia: general EBL: minimal Drains none Specimen gb and contents to pathology Complications: none Sponge count correct at completion Disposition to recovery stable  Indications: This is a 31 yof with gallstones on an ultrasound with symptoms of biliary colic. We discussed proceeding with lap chole.   Procedure: After informed consent was obtained the patient was taken to the operating room. She was already given antibiotics. Sequential compression devices were on her legs. She was placed under general anesthesia without complication. Her abdomen was prepped and draped in the standard sterile surgical fashion. A surgical timeout was then performed.  I infiltrated marcaine below the umbilicus. I made an incision and then entered the fascia sharply. I opened the umbilical hernia a little more.  I lifted the umbilcus off.  The skin was adherent to hernia sac and this was very thin. There was no evidence of an entry injury. I placed a 0 vicryl pursestring suture and inserted a hasson trocar. I then inserted 3 further 5 mm trocars in the epigastrium and ruq. I then was able to retract the gallbladder cephalad and lateral.  Eventually I was able to identify the cystic duct and clearly had the critical view of safety. I then clipped the cystic duct and divided it. The duct was viable and the clips traversed the duct.  I then clipped the cystic artery and divided it. I then removed the gallbladder from the liver bed and placed it in a bag. It was then removed from the umbilical incision. I then obtained hemostasis and irrigated. I then removed the umbilical trocar. I then closed the umbilical hernia with three #1 ethibond sutures.  I then desufflated the abdomen and removed all my remaining trocars.  I tacked the umbilicus down with 3-0 vicryl.  I then closed these with 4-0 Monocryl and Dermabond. She tolerated this well was extubated and transferred to the recovery room in stable condition

## 2014-09-11 NOTE — Transfer of Care (Signed)
Immediate Anesthesia Transfer of Care Note  Patient: Chelsey Berger  Procedure(s) Performed: Procedure(s): LAPAROSCOPIC CHOLECYSTECTOMY (N/A) HERNIA REPAIR UMBILICAL ADULT (N/A)  Patient Location: PACU  Anesthesia Type:General  Level of Consciousness: awake, alert , oriented, patient cooperative and responds to stimulation  Airway & Oxygen Therapy: Patient Spontanous Breathing and Patient connected to nasal cannula oxygen  Post-op Assessment: Report given to RN and Post -op Vital signs reviewed and stable  Post vital signs: Reviewed and stable  Last Vitals:  Filed Vitals:   09/11/14 1030  BP: 128/69  Pulse: 81  Temp:   Resp: 19    Complications: No apparent anesthesia complications

## 2014-09-11 NOTE — Anesthesia Postprocedure Evaluation (Signed)
  Anesthesia Post-op Note  Patient: Chelsey Berger  Procedure(s) Performed: Procedure(s): LAPAROSCOPIC CHOLECYSTECTOMY (N/A) HERNIA REPAIR UMBILICAL ADULT (N/A)  Patient Location: PACU  Anesthesia Type:General  Level of Consciousness: awake and alert   Airway and Oxygen Therapy: Patient Spontanous Breathing  Post-op Pain: Controlled  Post-op Assessment: Post-op Vital signs reviewed, Patient's Cardiovascular Status Stable and Respiratory Function Stable  Post-op Vital Signs: Reviewed  Filed Vitals:   09/11/14 1100  BP: 127/72  Pulse: 64  Temp:   Resp: 10    Complications: No apparent anesthesia complications

## 2014-09-11 NOTE — Progress Notes (Signed)
Waiting on short stay to call back with bed

## 2014-09-11 NOTE — Anesthesia Procedure Notes (Signed)
Procedure Name: Intubation Date/Time: 09/11/2014 9:27 AM Performed by: Judeth Cornfield T Pre-anesthesia Checklist: Patient identified, Emergency Drugs available, Suction available, Patient being monitored and Timeout performed Patient Re-evaluated:Patient Re-evaluated prior to inductionOxygen Delivery Method: Circle system utilized Preoxygenation: Pre-oxygenation with 100% oxygen Intubation Type: IV induction Ventilation: Mask ventilation without difficulty Laryngoscope Size: Mac and 3 Grade View: Grade I Tube type: Oral Tube size: 7.0 mm Number of attempts: 1 Airway Equipment and Method: Stylet and LTA kit utilized Placement Confirmation: ETT inserted through vocal cords under direct vision,  positive ETCO2 and breath sounds checked- equal and bilateral Secured at: 20 cm Tube secured with: Tape Dental Injury: Teeth and Oropharynx as per pre-operative assessment

## 2014-09-11 NOTE — Anesthesia Preprocedure Evaluation (Addendum)
Anesthesia Evaluation  Patient identified by MRN, date of birth, ID band Patient awake    Reviewed: Allergy & Precautions, H&P , NPO status , Patient's Chart, lab work & pertinent test results  Airway Mallampati: I  TM Distance: >3 FB Neck ROM: Full    Dental no notable dental hx. (+) Teeth Intact, Dental Advisory Given   Pulmonary neg pulmonary ROS,  breath sounds clear to auscultation  Pulmonary exam normal       Cardiovascular negative cardio ROS  Rhythm:Regular Rate:Normal     Neuro/Psych negative neurological ROS  negative psych ROS   GI/Hepatic negative GI ROS, Neg liver ROS,   Endo/Other  negative endocrine ROS  Renal/GU negative Renal ROS  negative genitourinary   Musculoskeletal   Abdominal   Peds  Hematology negative hematology ROS (+)   Anesthesia Other Findings   Reproductive/Obstetrics negative OB ROS                             Anesthesia Physical Anesthesia Plan  ASA: I  Anesthesia Plan: General   Post-op Pain Management:    Induction: Intravenous  Airway Management Planned: Oral ETT  Additional Equipment:   Intra-op Plan:   Post-operative Plan: Extubation in OR  Informed Consent: I have reviewed the patients History and Physical, chart, labs and discussed the procedure including the risks, benefits and alternatives for the proposed anesthesia with the patient or authorized representative who has indicated his/her understanding and acceptance.   Dental advisory given  Plan Discussed with: CRNA  Anesthesia Plan Comments:         Anesthesia Quick Evaluation  

## 2014-09-11 NOTE — H&P (Signed)
29 yof who presents with several months of epigastric and ruq pain. she has some suprapubic pain also. she has some nausea, no emesis. no change in her bms. she has undergone an u/s that showed cholelithiasis without any gbw thickening or any fluid. the cbd is normal and liver is normal. she is here to discuss possible cholecystectomy   Other Problems Ethlyn Gallery, CMA; 09/04/2014 9:21 AM) Cholelithiasis  Past Surgical History Elease Hashimoto Spillers, CMA; 09/04/2014 9:21 AM) No pertinent past surgical history  Diagnostic Studies History Ethlyn Gallery, CMA; 09/04/2014 9:21 AM) Colonoscopy never Mammogram within last year Pap Smear 1-5 years ago  Allergies Ethlyn Gallery, CMA; 09/04/2014 9:22 AM) No Known Drug Allergies07/29/2016  Medication History (Alisha Spillers, CMA; 09/04/2014 9:22 AM) No Current Medications Medications Reconciled  Social History Ethlyn Gallery, CMA; 09/04/2014 9:21 AM) Alcohol use Remotely quit alcohol use. Caffeine use Coffee. No drug use Tobacco use Never smoker.  Family History Ethlyn Gallery, CMA; 09/04/2014 9:21 AM) First Degree Relatives No pertinent family history  Pregnancy / Birth History Ethlyn Gallery, CMA; 09/04/2014 9:21 AM) Age at menarche 12 years. Contraceptive History Contraceptive implant. Gravida 6 Irregular periods Maternal age 86-20 Para 5  Review of Systems Elease Hashimoto Spillers CMA; 09/04/2014 9:21 AM) General Not Present- Appetite Loss, Chills, Fatigue, Fever, Night Sweats, Weight Gain and Weight Loss. Skin Not Present- Change in Wart/Mole, Dryness, Hives, Jaundice, New Lesions, Non-Healing Wounds, Rash and Ulcer. HEENT Not Present- Earache, Hearing Loss, Hoarseness, Nose Bleed, Oral Ulcers, Ringing in the Ears, Seasonal Allergies, Sinus Pain, Sore Throat, Visual Disturbances, Wears glasses/contact lenses and Yellow Eyes. Respiratory Not Present- Bloody sputum, Chronic Cough, Difficulty Breathing, Snoring  and Wheezing. Breast Not Present- Breast Mass, Breast Pain, Nipple Discharge and Skin Changes. Cardiovascular Not Present- Chest Pain, Difficulty Breathing Lying Down, Leg Cramps, Palpitations, Rapid Heart Rate, Shortness of Breath and Swelling of Extremities. Gastrointestinal Present- Abdominal Pain and Nausea. Not Present- Bloating, Bloody Stool, Change in Bowel Habits, Chronic diarrhea, Constipation, Difficulty Swallowing, Excessive gas, Gets full quickly at meals, Hemorrhoids, Indigestion, Rectal Pain and Vomiting. Female Genitourinary Not Present- Frequency, Nocturia, Painful Urination, Pelvic Pain and Urgency. Musculoskeletal Not Present- Back Pain, Joint Pain, Joint Stiffness, Muscle Pain, Muscle Weakness and Swelling of Extremities. Neurological Not Present- Decreased Memory, Fainting, Headaches, Numbness, Seizures, Tingling, Tremor, Trouble walking and Weakness. Psychiatric Not Present- Anxiety, Bipolar, Change in Sleep Pattern, Depression, Fearful and Frequent crying. Endocrine Not Present- Cold Intolerance, Excessive Hunger, Hair Changes, Heat Intolerance, Hot flashes and New Diabetes. Hematology Not Present- Easy Bruising, Excessive bleeding, Gland problems, HIV and Persistent Infections.   Vitals (Alisha Spillers CMA; 09/04/2014 9:22 AM) 09/04/2014 9:22 AM Weight: 140 lb Height: 62in Body Surface Area: 1.67 m Body Mass Index: 25.61 kg/m Pulse: 70 (Regular)  BP: 106/76 (Sitting, Left Arm, Standard)    Physical Exam Emelia Loron MD; 09/04/2014 12:48 PM) General Mental Status-Alert. Orientation-Oriented X3.  Chest and Lung Exam Chest and lung exam reveals -on auscultation, normal breath sounds, no adventitious sounds and normal vocal resonance.  Cardiovascular Cardiovascular examination reveals -normal heart sounds, regular rate and rhythm with no murmurs.  Abdomen Note: soft moderately tender ruq to palpation     Assessment & Plan Emelia Loron MD; 09/04/2014 12:48 PM) GALLSTONES (574.20  K80.20) Story: Laparoscopic cholecystectomy Her symptoms do appear they are coming from her gallbladder I discussed the procedure in detail. The patient was given Agricultural engineer. We discussed the risks and benefits of a laparoscopic cholecystectomy and possible cholangiogram including, but not limited to bleeding,  infection, injury to surrounding structures such as the intestine or liver, bile leak, retained gallstones, need to convert to an open procedure, prolonged diarrhea, blood clots such as DVT, common bile duct injury, anesthesia risks, and possible need for additional procedures. The likelihood of improvement in symptoms and return to the patient's normal status is good. We discussed the typical post-operative recovery course.

## 2014-09-14 ENCOUNTER — Encounter (HOSPITAL_COMMUNITY): Payer: Self-pay | Admitting: General Surgery

## 2015-08-30 DIAGNOSIS — G43109 Migraine with aura, not intractable, without status migrainosus: Secondary | ICD-10-CM | POA: Insufficient documentation

## 2015-09-21 DIAGNOSIS — R197 Diarrhea, unspecified: Secondary | ICD-10-CM | POA: Insufficient documentation

## 2015-09-21 DIAGNOSIS — R1030 Lower abdominal pain, unspecified: Secondary | ICD-10-CM | POA: Insufficient documentation

## 2015-11-05 ENCOUNTER — Encounter (INDEPENDENT_AMBULATORY_CARE_PROVIDER_SITE_OTHER): Payer: Self-pay

## 2015-11-05 ENCOUNTER — Encounter: Payer: Self-pay | Admitting: Sports Medicine

## 2015-11-05 ENCOUNTER — Ambulatory Visit (INDEPENDENT_AMBULATORY_CARE_PROVIDER_SITE_OTHER): Payer: Medicaid Other | Admitting: Sports Medicine

## 2015-11-05 VITALS — Resp 16 | Ht 62.0 in | Wt 123.0 lb

## 2015-11-05 DIAGNOSIS — L74519 Primary focal hyperhidrosis, unspecified: Secondary | ICD-10-CM | POA: Diagnosis not present

## 2015-11-05 DIAGNOSIS — R61 Generalized hyperhidrosis: Secondary | ICD-10-CM

## 2015-11-05 DIAGNOSIS — B353 Tinea pedis: Secondary | ICD-10-CM

## 2015-11-05 DIAGNOSIS — M79675 Pain in left toe(s): Secondary | ICD-10-CM | POA: Diagnosis not present

## 2015-11-05 DIAGNOSIS — M79674 Pain in right toe(s): Secondary | ICD-10-CM | POA: Diagnosis not present

## 2015-11-05 MED ORDER — CLOTRIMAZOLE 1 % EX SOLN: 1.0000 "application " | Freq: Two times a day (BID) | CUTANEOUS | 5 refills | Status: AC

## 2015-11-05 MED ORDER — KETOCONAZOLE 2 % EX CREA: 1.0000 "application " | TOPICAL_CREAM | Freq: Every day | CUTANEOUS | 5 refills | Status: AC

## 2015-11-05 MED ORDER — ALUMINUM CHLORIDE 20 % EX SOLN: Freq: Every day | CUTANEOUS | 5 refills | Status: AC

## 2015-11-05 NOTE — Progress Notes (Signed)
Subjective: Chelsey Berger is a 30 y.o. female patient seen today in office with complaint of painful cracking skin reports that her PCP told her to get OTC lotrimin spray and cream which has not helped. Reports that this started 1 year ago after a pedicure. Patient has no other pedal complaints at this time.   Patient Active Problem List   Diagnosis Date Noted  . Diarrhea of presumed infectious origin 09/21/2015  . Lower abdominal pain 09/21/2015  . Migraine with aura and without status migrainosus, not intractable 08/30/2015    Current Outpatient Prescriptions on File Prior to Visit  Medication Sig Dispense Refill  . acetaminophen (TYLENOL) 325 MG tablet Take 650 mg by mouth every 6 (six) hours as needed for mild pain.    Marland Kitchen. etonogestrel (IMPLANON) 68 MG IMPL implant 1 each by Subdermal route once.    Marland Kitchen. HYDROcodone-acetaminophen (NORCO) 5-325 MG per tablet Take 1-2 tablets by mouth every 4 (four) hours as needed. 15 tablet 0  . ondansetron (ZOFRAN ODT) 4 MG disintegrating tablet 4mg  ODT q4 hours prn nausea/vomit 4 tablet 0  . pantoprazole (PROTONIX) 40 MG tablet Take 40 mg by mouth daily.     No current facility-administered medications on file prior to visit.     Allergies  Allergen Reactions  . Sulfa Antibiotics Diarrhea    Objective: Physical Exam  General: Well developed, nourished, no acute distress, awake, alert and oriented x 3  Vascular: Dorsalis pedis artery 2/4 bilateral, Posterior tibial artery 2/4 bilateral, skin temperature warm to warm proximal to distal bilateral lower extremities, no varicosities, pedal hair present bilateral. Increased moisture to foot suggestive of hyperhydrosis.   Neurological: Gross sensation present via light touch bilateral.   Dermatological: Skin is warm, dry, and supple bilateral, Nails 1-10 are short with minimal thickness, and discoloration with mild subungal debris at right 5th toe nail, no webspace macerations present bilateral  however there is dry scaling right>left foot consistent with tinea, no open lesions present bilateral, no callus/corns present bilateral. No signs of infection bilateral.  Musculoskeletal: No boney deformities noted bilateral. Muscular strength within normal limits without pain on range of motion. No pain with calf compression bilateral.  Assessment and Plan:  Problem List Items Addressed This Visit    None    Visit Diagnoses    Tinea pedis of both feet    -  Primary   Relevant Medications   ketoconazole (NIZORAL) 2 % cream   aluminum chloride (DRYSOL) 20 % external solution   clotrimazole (LOTRIMIN) 1 % external solution   Toe pain, bilateral       Relevant Medications   ketoconazole (NIZORAL) 2 % cream   aluminum chloride (DRYSOL) 20 % external solution   clotrimazole (LOTRIMIN) 1 % external solution   Hyperhydrosis disorder       Relevant Medications   ketoconazole (NIZORAL) 2 % cream   aluminum chloride (DRYSOL) 20 % external solution   clotrimazole (LOTRIMIN) 1 % external solution     -Examined patient -Discussed treatment options for tinea and hyperhydrosis -Rx Ketoconazole cream, clotrimazole solution, and drysol spray to use as instructed  -Advised patient to try medication for 1 month if no improvement will consider oral lamisil -Recommend good hygiene habits -Patient to return as needed for follow up evaluation or sooner if symptoms worsen.  Asencion Islamitorya Avin Gibbons, DPM
